# Patient Record
Sex: Female | Born: 1945 | Race: White | Hispanic: No | Marital: Married | Smoking: Never smoker
Health system: Southern US, Community
[De-identification: ages and names within clinical notes are randomized; demographics above are authoritative.]

## PROBLEM LIST (undated history)

## (undated) DIAGNOSIS — I10 Essential (primary) hypertension: Secondary | ICD-10-CM

## (undated) DIAGNOSIS — E7800 Pure hypercholesterolemia, unspecified: Secondary | ICD-10-CM

## (undated) HISTORY — PX: ABDOMINAL HYSTERECTOMY: SHX81

---

## 2011-05-11 ENCOUNTER — Emergency Department (HOSPITAL_BASED_OUTPATIENT_CLINIC_OR_DEPARTMENT_OTHER)
Admission: EM | Admit: 2011-05-11 | Discharge: 2011-05-11 | Disposition: A | Discharge status: Home / Self Care | Payer: Medicare Other | Attending: Emergency Medicine | Admitting: Emergency Medicine

## 2011-05-11 ENCOUNTER — Encounter (HOSPITAL_BASED_OUTPATIENT_CLINIC_OR_DEPARTMENT_OTHER): Payer: Self-pay | Admitting: Family Medicine

## 2011-05-11 ENCOUNTER — Emergency Department (INDEPENDENT_AMBULATORY_CARE_PROVIDER_SITE_OTHER): Payer: Medicare Other

## 2011-05-11 DIAGNOSIS — S0180XA Unspecified open wound of other part of head, initial encounter: Secondary | ICD-10-CM | POA: Insufficient documentation

## 2011-05-11 DIAGNOSIS — H5710 Ocular pain, unspecified eye: Secondary | ICD-10-CM

## 2011-05-11 DIAGNOSIS — R229 Localized swelling, mass and lump, unspecified: Secondary | ICD-10-CM

## 2011-05-11 DIAGNOSIS — R220 Localized swelling, mass and lump, head: Secondary | ICD-10-CM | POA: Insufficient documentation

## 2011-05-11 DIAGNOSIS — W19XXXA Unspecified fall, initial encounter: Secondary | ICD-10-CM

## 2011-05-11 DIAGNOSIS — Y9289 Other specified places as the place of occurrence of the external cause: Secondary | ICD-10-CM | POA: Insufficient documentation

## 2011-05-11 DIAGNOSIS — S0510XA Contusion of eyeball and orbital tissues, unspecified eye, initial encounter: Secondary | ICD-10-CM

## 2011-05-11 DIAGNOSIS — S0181XA Laceration without foreign body of other part of head, initial encounter: Secondary | ICD-10-CM

## 2011-05-11 HISTORY — DX: Essential (primary) hypertension: I10

## 2011-05-11 HISTORY — DX: Pure hypercholesterolemia, unspecified: E78.00

## 2011-05-11 MED ORDER — LIDOCAINE-EPINEPHRINE 2 %-1:100000 IJ SOLN
INTRAMUSCULAR | Status: AC
  Administered 2011-05-11: 10:00:00
  Filled 2011-05-11: qty 1

## 2011-05-11 NOTE — ED Notes (Signed)
MD at bedside. 

## 2011-05-11 NOTE — Discharge Instructions (Signed)
Facial Laceration A facial laceration is a cut on the face. It can take 1 to 2 years for the scar to heal completely. HOME CARE  For stitches (sutures):  Keep the cut clean and dry.   If you have a bandage (dressing), change it at least once a day. Change the bandage if it gets wet or dirty, or as told by your doctor.   Wash the cut with soap and water 2 times a day. Rinse the cut with water. Pat it dry with a clean towel.   Put a thin layer of medicated cream on the cut as told by your doctor.   You may shower after the first 24 hours. Do not soak the cut in water until the stitches are removed.   Only take medicines as told by your doctor.   Have your stitches removed as told by your doctor.   Do not wear makeup until a few days after your stitches are removed.  For skin adhesive strips:  Keep the cut clean and dry.   Do not get the strips wet. You may take a bath, but be careful to keep the cut dry.   If the cut gets wet, pat it dry with a clean towel.   The strips will fall off on their own. Do not remove the strips that are still stuck to the cut.  For wound glue:  You may shower or take baths. Do not soak or scrub the cut. Do not swim. Avoid heavy sweating until the glue falls off on its own. After a shower or bath, pat the cut dry with a clean towel.   Do not put medicine or makeup on your cut until the glue falls off.   If you have a bandage, do not put tape over the glue.   Avoid lots of sunlight or tanning lamps until the glue falls off. Put sunscreen on the cut for the first year to reduce your scar.   The glue will fall off on its own. Do not pick at the glue.  You may need a tetanus shot if:  You cannot remember when you had your last tetanus shot.   You have never had a tetanus shot.  If you need a tetanus shot and you choose not to have one, you may get tetanus. Sickness from tetanus can be serious. GET HELP RIGHT AWAY IF:   Your cut gets red, painful,  or puffy (swollen).   There is yellowish-white fluid (pus) coming from the cut.   You have chills or a fever.  MAKE SURE YOU:   Understand these instructions.   Will watch your condition.   Will get help right away if you are not doing well or get worse.  Document Released: 08/12/2007 Document Revised: 02/12/2011 Document Reviewed: 08/19/2010 [employer] Patient Information 2012 [employer]. 

## 2011-05-11 NOTE — ED Notes (Signed)
Pt sts she tripped and fell hitting head on brick. Pt has laceration above left eye. No loc. Alert and oriented.

## 2011-05-11 NOTE — ED Provider Notes (Addendum)
History     CSN: [id number]  Arrival date & time 05/11/11  4098   First MD Initiated Contact with Patient 05/11/11 [phone]      No chief complaint on file.   (Consider location/radiation/quality/duration/timing/severity/associated sxs/prior treatment) Patient is a 66 y.o. female presenting with head injury. The history is provided by the patient.  Head Injury  The incident occurred less than 1 hour ago (Took her grandson to school and the little girl fell in front of her and she tripped and fell onto the concrete sustaining a laceration over her left eye). She came to the ER via walk-in. The injury mechanism was a direct blow and a fall. There was no loss of consciousness (Dazed). The volume of blood lost was minimal. The quality of the pain is described as dull. The pain is at a severity of 3/10. The pain is moderate. Pertinent negatives include no vomiting, no disorientation and no weakness. Associated symptoms comments: Headache. She has tried nothing for the symptoms. The treatment provided no relief.    No past medical history on file.  No past surgical history on file.  No family history on file.  History  Substance Use Topics  . Smoking status: Not on file  . Smokeless tobacco: Not on file  . Alcohol Use: Not on file    OB History    No data available      Review of Systems  HENT: Positive for facial swelling.   Gastrointestinal: Negative for vomiting.  Neurological: Negative for weakness.  All other systems reviewed and are negative.    Allergies  Review of patient's allergies indicates not on file.  Home Medications  No current outpatient prescriptions on file.  BP 187/73  Pulse 58  Temp(Src) 97.9 F (36.6 C) (Oral)  Resp 14  SpO2 100%  Physical Exam  Nursing note and vitals reviewed. Constitutional: She is oriented to person, place, and time. She appears well-developed and well-nourished. No distress.  HENT:  Head: Normocephalic.    Eyes: EOM are  normal. Pupils are equal, round, and reactive to light.  Neck: Normal range of motion. No spinous process tenderness and no muscular tenderness present.  Cardiovascular: Normal rate, regular rhythm, normal heart sounds and intact distal pulses.  Exam reveals no friction rub.   No murmur heard. Pulmonary/Chest: Effort normal and breath sounds normal. She has no wheezes. She has no rales.  Musculoskeletal: Normal range of motion. She exhibits no edema and no tenderness.       No edema  Neurological: She is alert and oriented to person, place, and time. No cranial nerve deficit.  Skin: Skin is warm and dry. No rash noted.  Psychiatric: She has a normal mood and affect. Her behavior is normal.    ED Course  Procedures (including critical care time)  Labs Reviewed - No data to display Ct Head Wo Contrast  05/11/2011  *RADIOLOGY REPORT*  Clinical Data: Fall with left eye trauma.  Swelling.  Pain.  CT HEAD WITHOUT CONTRAST  Technique:  Contiguous axial images were obtained from the base of the skull through the vertex without contrast.  Comparison: None.  Findings: No evidence of acute infarct, acute hemorrhage, mass lesion, mass effect or hydrocephalus.  Mild atrophy.  Mucosal thickening in the right sphenoid sinus.  No air fluid levels. Mastoid air cells are clear.  No fracture.  Mild left periorbital edema.  IMPRESSION:  1.  No acute intracranial abnormality. 2.  Mild left periorbital soft tissue swelling.  3.  Mild atrophy.  Original Report Authenticated By: Reyes Ivan, M.D.    LACERATION REPAIR Performed by: Gwyneth Sprout Authorized byGwyneth Sprout Consent: Verbal consent obtained. Risks and benefits: risks, benefits and alternatives were discussed Consent given by: patient Patient identity confirmed: provided demographic data Prepped and Draped in normal sterile fashion Wound explored  Laceration Location: Left eyebrow  Laceration Length: 4 cm  No Foreign Bodies seen or  palpated  Anesthesia: local infiltration  Local anesthetic: lidocaine 1 % with epinephrine  Anesthetic total: 4 ml  Irrigation method: syringe Amount of cleaning: standard  Skin closure: Prolene 6.0   Number of sutures: 7  Technique: Simple interrupted   Patient tolerance: Patient tolerated the procedure well with no immediate complications.  1. Facial laceration       MDM   Patient with a mechanical fall today falling face down onto the concrete. Has a moderate-sized laceration of her left eye. She states she was dazed after the fall but no complete LOC. She currently is complaining of a headache but no visual changes or neuro deficits on exam.  Her tetanus is outdated however she has an allergy to tetanus and cannot receive a shock. Head CT negative for intracranial abnormalities. Wound repaired as above. Patient is able to ambulate without difficulty and discharged home       Gwyneth Sprout, MD 05/11/11 1009  Gwyneth Sprout, MD 05/11/11 1027  Gwyneth Sprout, MD 05/11/11 1029

## 2011-05-11 NOTE — ED Notes (Signed)
Patient transported to CT 

## 2013-03-04 IMAGING — CT CT HEAD W/O CM
1 series · 16 of 30 positions shown, 20 images · non-contrast
Comparison: None.

CLINICAL DATA: Fall with left eye trauma.  Swelling.  Pain.

CT HEAD WITHOUT CONTRAST
TECHNIQUE: Contiguous axial images were obtained from the base of
the skull through the vertex without contrast.

[Series 2: head 4.8 h37s · axial · 0.49mm/px · z∈[-52,+81]mm · 16 of 32 slices shown, 20 images]
[im 2/32  brain]
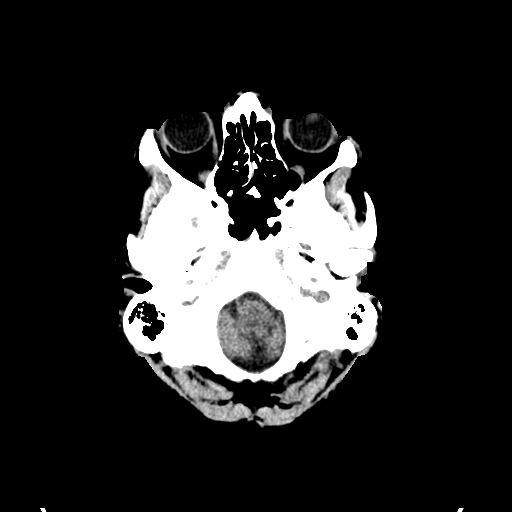
[im 2/32  bone]
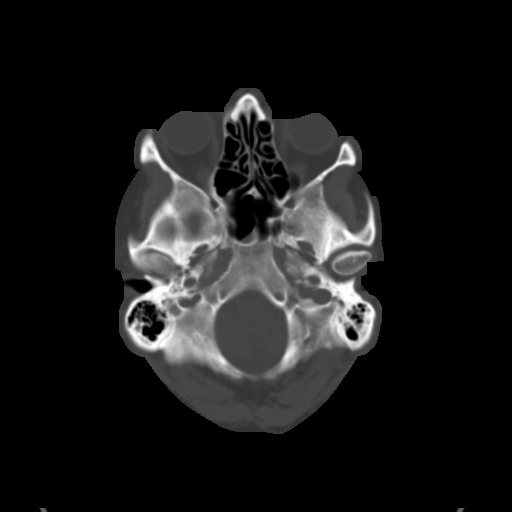
[im 4/32  brain]
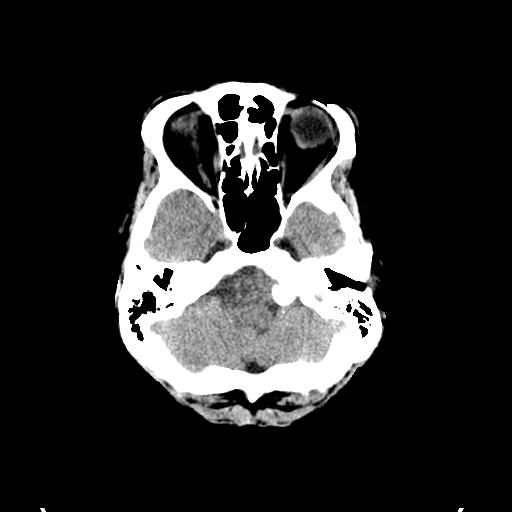
[im 6/32  brain]
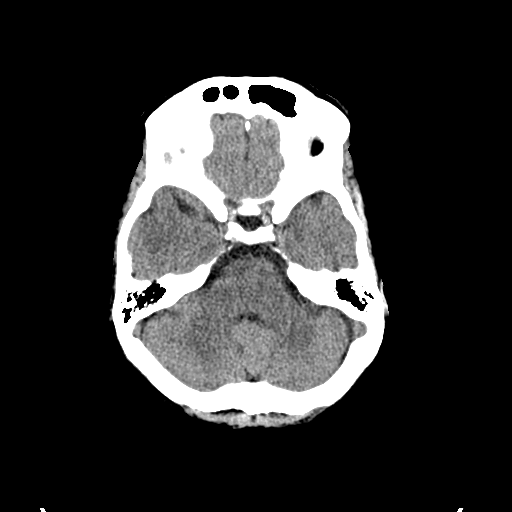
[im 8/32  brain]
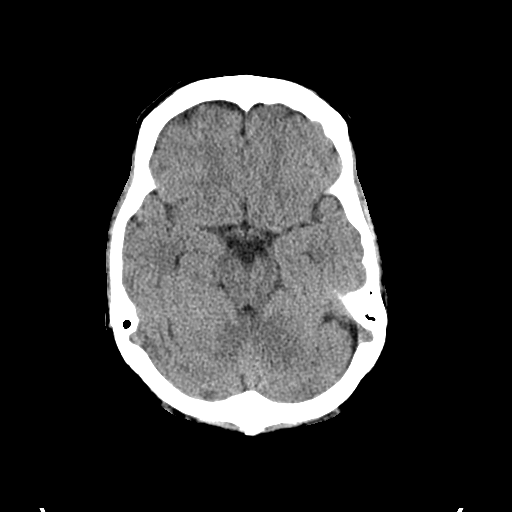
[im 9/32  brain]
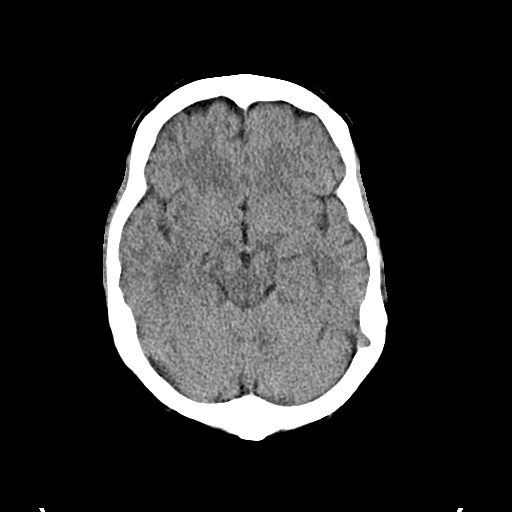
[im 9/32  bone]
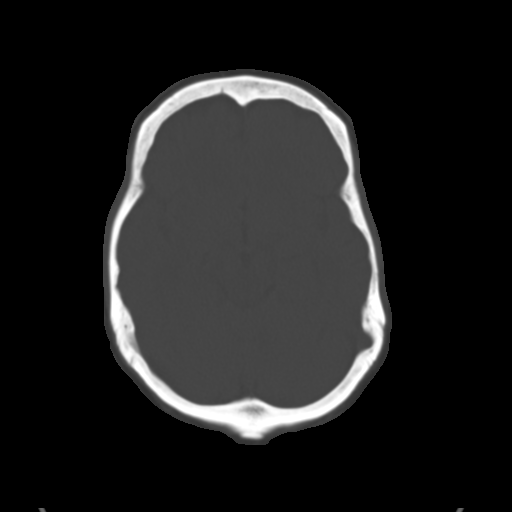
[im 11/32  brain]
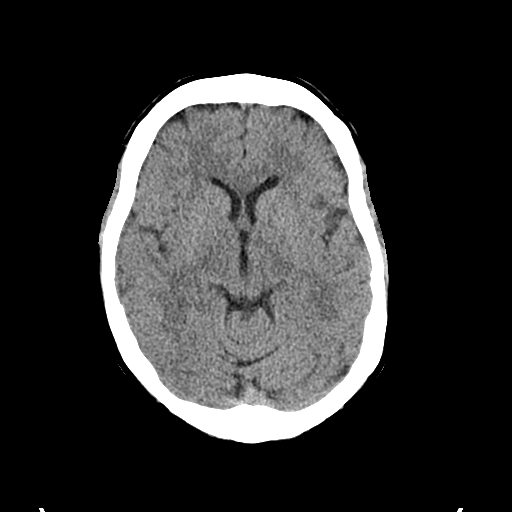
[im 13/32  brain]
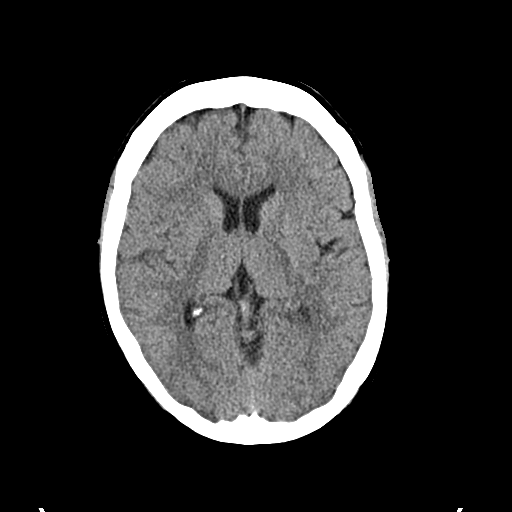
[im 15/32  brain]
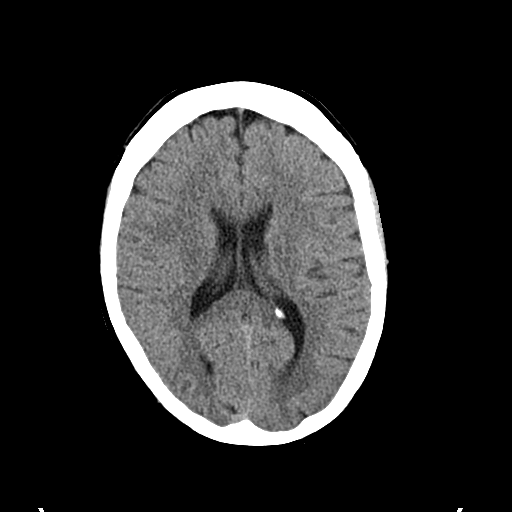
[im 17/32  brain]
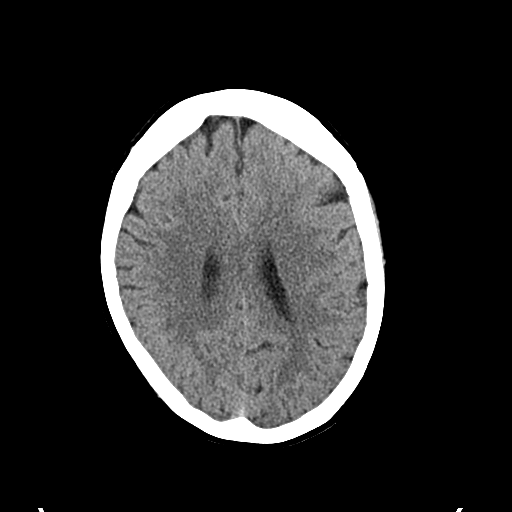
[im 17/32  bone]
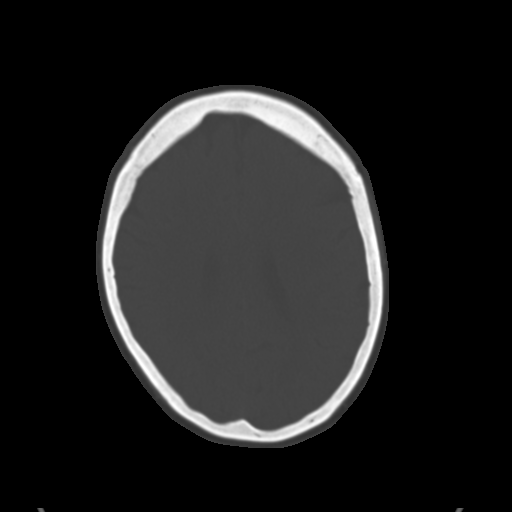
[im 19/32  brain]
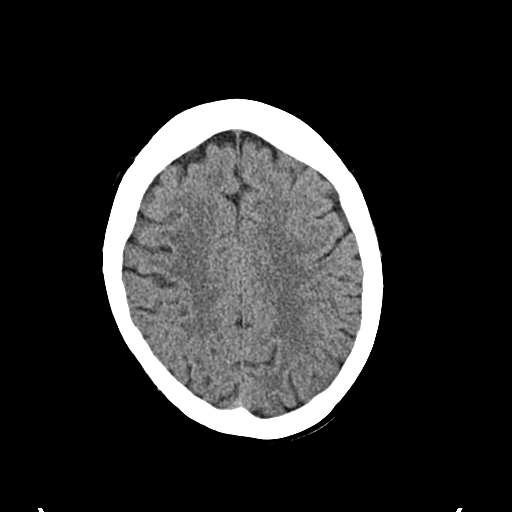
[im 21/32  brain]
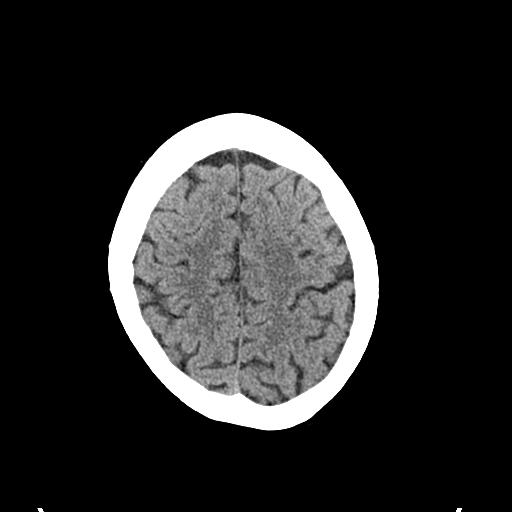
[im 23/32  brain]
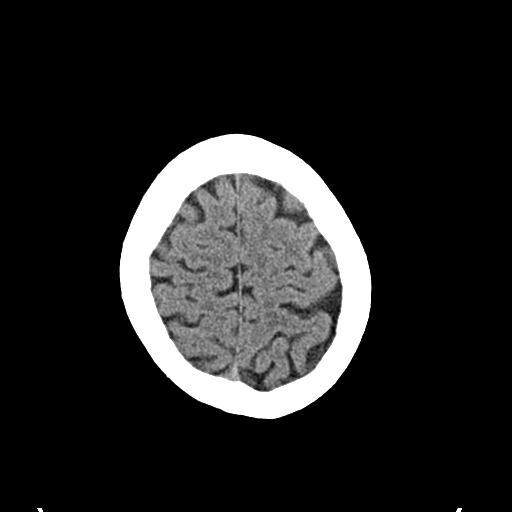
[im 24/32  brain]
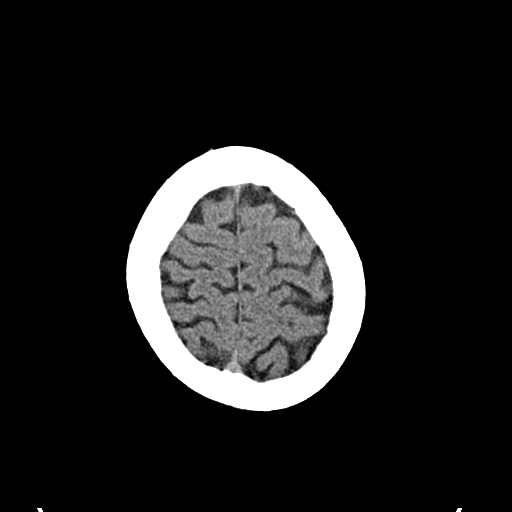
[im 24/32  bone]
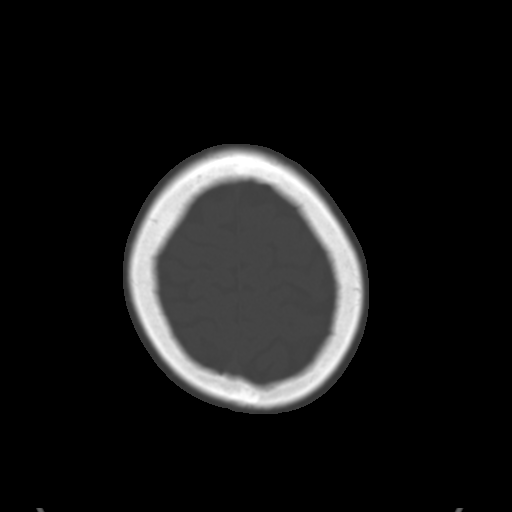
[im 26/32  brain]
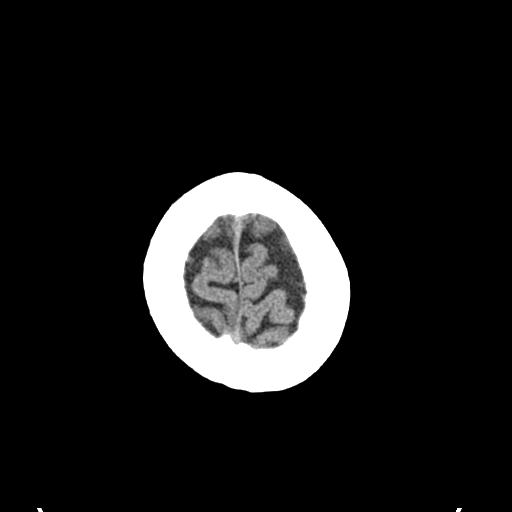
[im 28/32  brain]
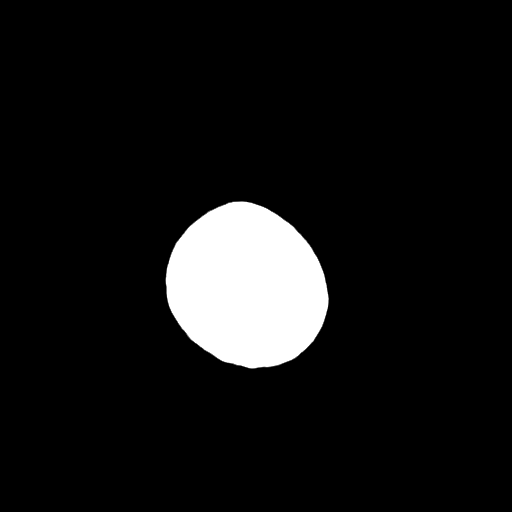
[im 30/32  brain]
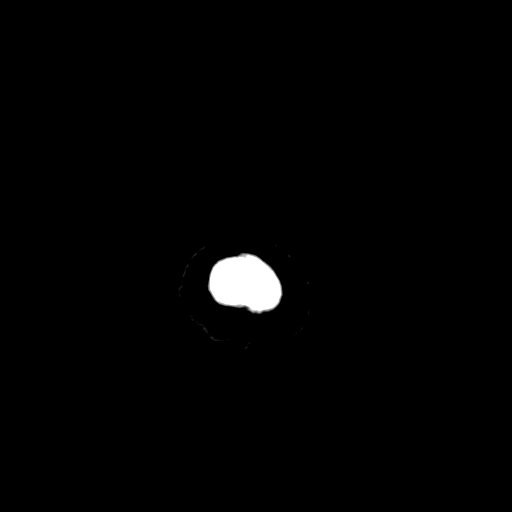

[16 of 30 positions shown; findings below may reference images not displayed]

FINDINGS: No evidence of acute infarct, acute hemorrhage, mass
lesion, mass effect or hydrocephalus.  Mild atrophy.  Mucosal
thickening in the right sphenoid sinus.  No air fluid levels.
Mastoid air cells are clear.  No fracture.  Mild left periorbital
edema.
IMPRESSION: 1.  No acute intracranial abnormality.
2.  Mild left periorbital soft tissue swelling.
3.  Mild atrophy.

## 2019-01-12 ENCOUNTER — Other Ambulatory Visit: Payer: Self-pay

## 2019-01-12 DIAGNOSIS — Z20822 Contact with and (suspected) exposure to covid-19: Secondary | ICD-10-CM

## 2019-01-13 LAB — NOVEL CORONAVIRUS, NAA: SARS-CoV-2, NAA: NOT DETECTED

## 2019-05-13 ENCOUNTER — Ambulatory Visit: Payer: Medicare Other | Attending: Internal Medicine

## 2019-05-13 DIAGNOSIS — Z23 Encounter for immunization: Secondary | ICD-10-CM | POA: Insufficient documentation

## 2019-05-13 NOTE — Progress Notes (Signed)
   Covid-19 Vaccination Clinic  Name:  Cheryl Miranda    MRN: [id number] DOB: June 03, 1945  05/13/2019  [name] was observed post Covid-19 immunization for 15 minutes without incident. She was provided with Vaccine Information Sheet and instruction to access the V-Safe system.   [name] was instructed to call 911 with any severe reactions post vaccine: Marland Kitchen Difficulty breathing  . Swelling of face and throat  . A fast heartbeat  . A bad rash all over body  . Dizziness and weakness   Immunizations Administered    Name Date Dose VIS Date Route   Pfizer COVID-19 Vaccine 05/13/2019 10:55 AM 0.3 mL 02/17/2019 Intramuscular   Manufacturer: [employer], [employer]   Lot: [id number]   NDC: [address]-1

## 2019-06-03 ENCOUNTER — Ambulatory Visit: Payer: Medicare Other | Attending: Internal Medicine

## 2019-06-03 DIAGNOSIS — Z23 Encounter for immunization: Secondary | ICD-10-CM

## 2019-06-03 NOTE — Progress Notes (Signed)
   Covid-19 Vaccination Clinic  Name:  Cheryl Miranda    MRN: [id number] DOB: 07-03-1945  06/03/2019  [name] was observed post Covid-19 immunization for 15 minutes without incident. She was provided with Vaccine Information Sheet and instruction to access the V-Safe system.   [name] was instructed to call 911 with any severe reactions post vaccine: Marland Kitchen Difficulty breathing  . Swelling of face and throat  . A fast heartbeat  . A bad rash all over body  . Dizziness and weakness   Immunizations Administered    Name Date Dose VIS Date Route   Pfizer COVID-19 Vaccine 06/03/2019 12:05 PM 0.3 mL 02/17/2019 Intramuscular   Manufacturer: [employer], [employer]   Lot: [id number]   NDC: [address]-9
